# Patient Record
Sex: Female | Born: 1995 | Race: White | Hispanic: No | Marital: Single | Smoking: Never smoker
Health system: Southern US, Community
[De-identification: ages and names within clinical notes are randomized; demographics above are authoritative.]

## PROBLEM LIST (undated history)

## (undated) DIAGNOSIS — M0800 Unspecified juvenile rheumatoid arthritis of unspecified site: Secondary | ICD-10-CM

## (undated) HISTORY — PX: THYROID CYST EXCISION: SHX2511

## (undated) HISTORY — PX: TYMPANOSTOMY TUBE PLACEMENT: SHX32

## (undated) HISTORY — PX: TONSILLECTOMY: SUR1361

---
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
## 2015-05-12 IMAGING — CR DG FINGER THUMB 2+V*L*
3 series · 3 of 3 positions shown · non-contrast
Comparison: None.

CLINICAL DATA: Injured left thumb.

EXAM:
LEFT THUMB 2+V

[x finger pa left]
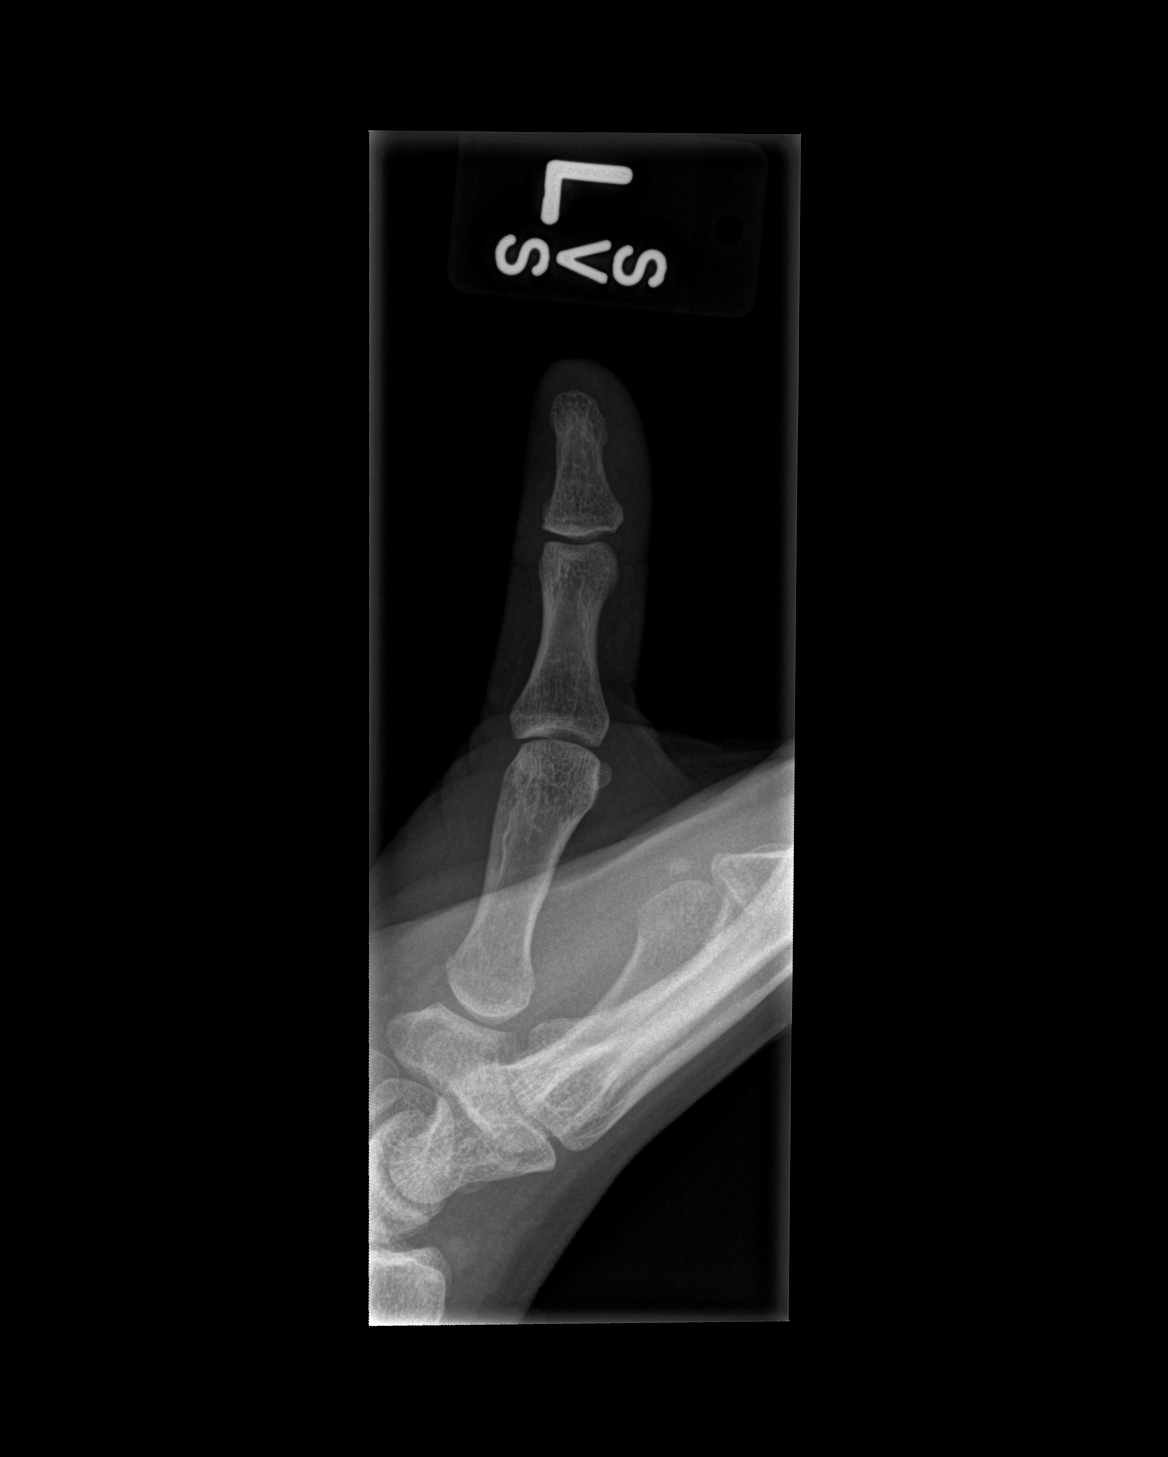

[x finger obl left]
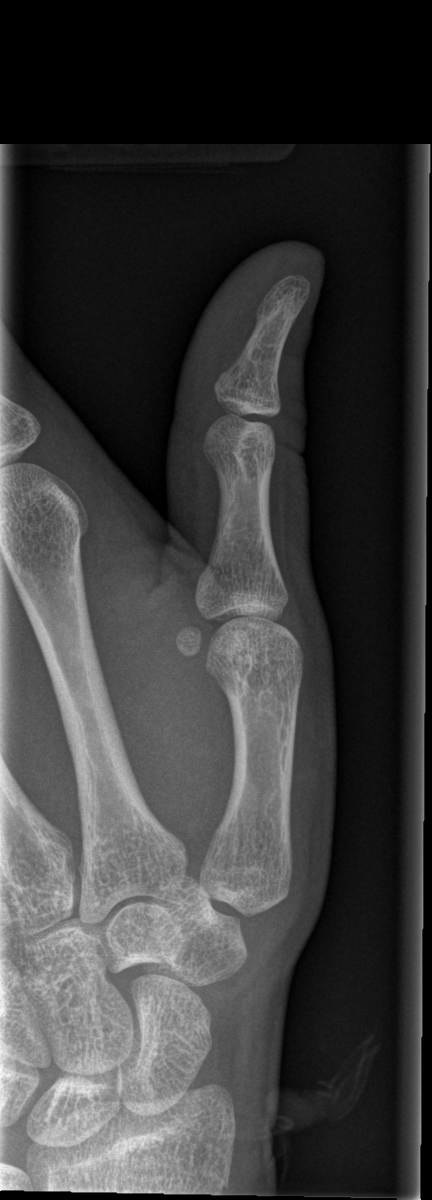

[x finger lat left]
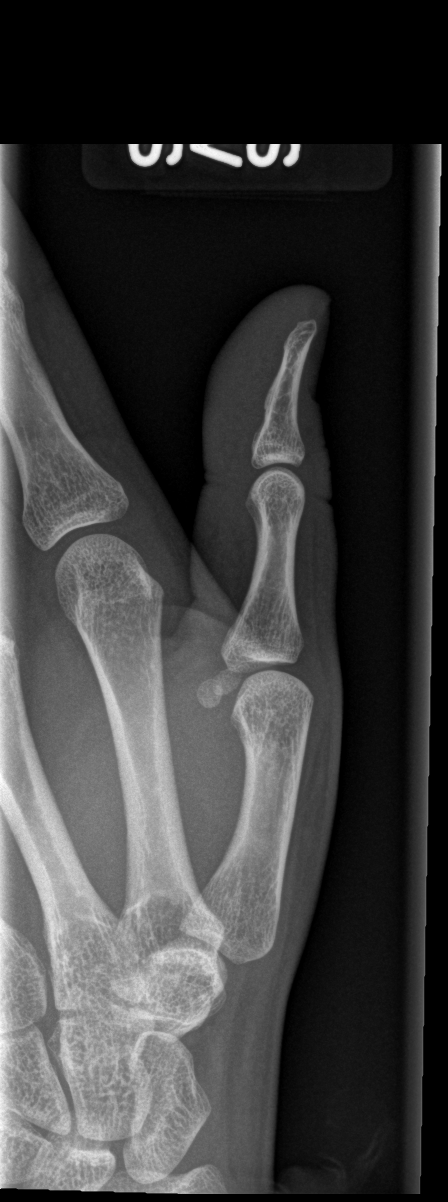

[3 of 3 positions shown; findings below may reference images not displayed]

FINDINGS: The joint spaces are maintained.  No acute fracture.
IMPRESSION: No acute bony findings.
